# Patient Record
Sex: Male | Born: 1975 | Hispanic: No | Marital: Married | Smoking: Never smoker
Health system: Southern US, Community
[De-identification: ages and names within clinical notes are randomized; demographics above are authoritative.]

---

## 2004-11-09 ENCOUNTER — Emergency Department (HOSPITAL_COMMUNITY): Admission: EM | Admit: 2004-11-09 | Discharge: 2004-11-09 | Payer: Self-pay | Admitting: Emergency Medicine

## 2012-08-16 ENCOUNTER — Ambulatory Visit: Payer: Self-pay | Admitting: Emergency Medicine

## 2012-08-16 ENCOUNTER — Ambulatory Visit: Payer: Self-pay

## 2012-08-16 VITALS — BP 112/86 | HR 74 | Temp 98.1°F | Resp 16 | Ht 69.5 in | Wt 179.0 lb

## 2012-08-16 DIAGNOSIS — M79673 Pain in unspecified foot: Secondary | ICD-10-CM

## 2012-08-16 DIAGNOSIS — M79609 Pain in unspecified limb: Secondary | ICD-10-CM

## 2012-08-16 MED ORDER — MELOXICAM 15 MG PO TABS: 15.0000 mg | ORAL_TABLET | Freq: Every day | ORAL | Status: DC

## 2012-08-16 NOTE — Progress Notes (Signed)
  Subjective:    Patient ID: Gordon Wright, male    DOB: 1976/07/15, 36 y.o.   MRN: [id number]  HPI  36 year old male presents with right foot pain started last Friday soaked in water but woke up because of the pain, pt took ibuprofen with no relief. Today cant walk, pt walks a lot at work. denies new shoes he had to work on his feet a lot more than he does usually. He has not had any pain over the great toe has no history of gout. He has no history of any pain over his heel.    Review of Systems     Objective:   Physical Exam there is significant tenderness over the distal fourth and fifth metatarsals. Pulses are 2+ and symmetrical. There is no tenderness over the heel the plantar fascia is no tenderness over the great toe  [facility] reading (PRIMARY) by  Dr. Cleta Alberts no fracture seen        Assessment & Plan:    This sounds like a metatarsalgia or a possible stress fracture. Check a film and treat symptoms for now.

## 2012-08-16 NOTE — Patient Instructions (Addendum)
Stress Fracture When too much stress is put on the foot, as in running and jumping sports, the center shaft of the bones of the forefoot is very susceptible to stress fractures (break in bones) because of thinness of this bone. This injury is more common if osteoporosis is present or if inadequate running shoes are used. Shoes should be used which adequately cushion the foot to absorb the shocks of the activity participated in. Stress fractures are very common in competitive male runners who develop these small cracks on the surface of the bones in their legs and feet. The women most likely to suffer these injuries are those who restrict food and those who have irregular periods. Stress fractures usually start out as a minor discomfort in the foot or leg. The fracture often occurs near the end of a long run. Usually the pain goes away with rest. On the next day, the pain returns earlier in the run. If an athlete notices that it hurts to touch just one spot on a bone and then stops running for a week, they can return to running quickly. But usually the pain is ignored and a stress fracture develops. The athlete now has to avoid the hard pounding of running, but can ride a bike or swim for exercise until the fracture heals in 6 to 12 weeks. The most common sites for stress fractures are the bones in the front of the feet and the long bone of the lower leg, but running can cause stress fractures anywhere, even in the pelvic bones. DIAGNOSIS  Usually the diagnosis is made by history. The bone involved progressively becomes sorer with activities. X-rays may be negative (show no break) within the first two to three weeks of the beginning of pain. A later x-ray may show signs of healing bone (callus formation). A bone scan will usually make the diagnosis earlier. HOME CARE INSTRUCTIONS  Treatment may or may not include a cast or walking shoe. When casts are needed the use is usually for short periods of time so as  not to slow down healing with muscle wasting (atrophy).  Activities should be stopped until further advised by your caregiver.  Wear shoes with adequate shock absorbing abilities.  Alternative exercise may be undertaken while waiting for healing. These may include bicycling and swimming, or as your caregiver suggests. If you do not have a cast or splint:  You may walk on your injured foot as tolerated or advised.  Do not put any weight on your injured foot until instructed. Slowly increase the amount of time you walk on the foot as the pain allows or as advised.  Use crutches until you can bear weight without pain. A gradual increase in weight bearing may help.  Apply ice to the injury for 15 to 20 minutes each hour while awake for the first 2 days. Put the ice in a plastic bag and place a towel between the bag of ice and your skin.  Only take over-the-counter or prescription medicines for pain, discomfort, or fever as directed by your caregiver.  If your caregiver has given you a follow-up appointment, it is very important to keep that appointment. Not keeping the appointment could result in a chronic or permanent injury, pain, and disability. If there is any problem keeping the appointment, you must call back to this facility for assistance. SEEK IMMEDIATE MEDICAL CARE IF:   Pain is becoming worse rather than better, or if pain is uncontrolled with medicine.  You  have increased swelling or redness in the foot. Document Released: 10/29/2002 Document Revised: 10/31/2011 Document Reviewed: 03/24/2008 [facility] Patient Information 2013 [place], [place].

## 2015-11-19 ENCOUNTER — Ambulatory Visit: Payer: BLUE CROSS/BLUE SHIELD

## 2015-11-19 ENCOUNTER — Ambulatory Visit (INDEPENDENT_AMBULATORY_CARE_PROVIDER_SITE_OTHER): Payer: BLUE CROSS/BLUE SHIELD

## 2015-11-19 ENCOUNTER — Ambulatory Visit (INDEPENDENT_AMBULATORY_CARE_PROVIDER_SITE_OTHER): Payer: BLUE CROSS/BLUE SHIELD | Admitting: Physician Assistant

## 2015-11-19 VITALS — BP 102/70 | HR 77 | Temp 98.8°F | Resp 16 | Ht 69.0 in | Wt 208.0 lb

## 2015-11-19 DIAGNOSIS — M25572 Pain in left ankle and joints of left foot: Secondary | ICD-10-CM | POA: Diagnosis not present

## 2015-11-19 DIAGNOSIS — S93402A Sprain of unspecified ligament of left ankle, initial encounter: Secondary | ICD-10-CM | POA: Diagnosis not present

## 2015-11-19 MED ORDER — IBUPROFEN 800 MG PO TABS: 400.0000 mg | ORAL_TABLET | Freq: Three times a day (TID) | ORAL | Status: AC

## 2015-11-19 MED ORDER — KETOROLAC TROMETHAMINE 60 MG/2ML IM SOLN
60.0000 mg | Freq: Once | INTRAMUSCULAR | Status: AC
  Administered 2015-11-19: 60 mg via INTRAMUSCULAR

## 2015-11-19 NOTE — Progress Notes (Signed)
11/19/2015 5:47 PM   DOB: Dec 28, 1975 / MRN: [id number]  SUBJECTIVE:  Gordon Wright is a 40 y.o. male presenting for left ankle pain that started last night. States that he was moving into a new house yesterday and had no sudden trauma to the ankle, but last night he began to have focal tenderness about the medial ankle. Today the pain is severe and he has pain with ambulation, and pain with dorsiflexion and plantar flexion.    He has No Known Allergies.   He  has no past medical history on file.    He  reports that he has never smoked. He does not have any smokeless tobacco history on file. He  has no sexual activity history on file. The patient  has no past surgical history on file.  His family history is not on file.  Review of Systems  Constitutional: Negative for fever and chills.  Genitourinary: Negative for dysuria.  Musculoskeletal: Positive for joint pain. Negative for back pain and falls.  Skin: Negative for rash.  Neurological: Negative for dizziness and headaches.    Problem list and medications reviewed and updated by myself where necessary, and exist elsewhere in the encounter.   OBJECTIVE:  BP 102/70 mmHg  Pulse 77  Temp(Src) 98.8 F (37.1 C)  Resp 16  Ht  (1.753 m)  Wt 208 lb (94.348 kg)  BMI 30.70 kg/m2  SpO2 99%  Physical Exam  Constitutional: He is oriented to person, place, and time. He appears well-developed. He does not appear ill.  Eyes: Conjunctivae and EOM are normal. Pupils are equal, round, and reactive to light.  Cardiovascular: Normal rate and regular rhythm.   Pulmonary/Chest: Effort normal and breath sounds normal.  Abdominal: He exhibits no distension.  Musculoskeletal: Normal range of motion.       Right ankle: Normal.       Feet:  Neurological: He is alert and oriented to person, place, and time. No cranial nerve deficit. Coordination normal.  Skin: Skin is warm and dry. He is not diaphoretic.  Psychiatric: He has a normal mood  and affect.  Nursing note and vitals reviewed.   No results found for this or any previous visit (from the past 72 hour(s)).  Dg Ankle Complete Left  11/19/2015  CLINICAL DATA:  Ankle pain.  No recent injury. EXAM: LEFT ANKLE COMPLETE - 3+ VIEW COMPARISON:  08/16/2012. FINDINGS: Mild soft tissue swelling. No acute bony or joint abnormality. No evidence of fracture or dislocation. IMPRESSION: Mild soft tissue swelling.  No acute abnormality identified. Electronically Signed   By: Maisie Fus  Register   On: 11/19/2015 17:10    ASSESSMENT AND PLAN  Gordon Wright was seen today for foot injury.  Diagnoses and all orders for this visit:  Ankle sprain, left, initial encounter: Rads negative for fracture.  Mild soft tissue swelling noted.  ASO provided to him and advised he wear for two weeks at least.  800 mg Ibuprofen q8 for pain. RTC as needed.   Left ankle pain -     ketorolac (TORADOL) injection 60 mg; Inject 2 mLs (60 mg total) into the muscle once. -     DG Ankle Complete Left; Future -     ibuprofen (ADVIL,MOTRIN) 800 MG tablet; Take 0.5-1 tablets (400-800 mg total) by mouth 3 (three) times daily. Take with food.  Do not take Aleve or Goody's while taking this medication.    The patient was advised to call or return to clinic if he  does not see an improvement in symptoms or to seek the care of the closest emergency department if he worsens with the above plan.   Deliah BostonMichael Clark, MHS, PA-C [facility] 11/19/2015 5:47 PM

## 2015-11-19 NOTE — Patient Instructions (Signed)
     IF you received an x-ray today, you will receive an invoice from [facility]. Please contact [facility] at [phone] with questions or concerns regarding your invoice.   IF you received labwork today, you will receive an invoice from [facility]. Please contact [facility] at [phone] with questions or concerns regarding your invoice.   Our billing staff will not be able to assist you with questions regarding bills from these companies.  You will be contacted with the lab results as soon as they are available. The fastest way to get your results is to activate your My Chart account. Instructions are located on the last page of this paperwork. If you have not heard from us regarding the results in 2 weeks, please contact this office.      

## 2016-08-18 IMAGING — CR DG ANKLE COMPLETE 3+V*L*
4 series · 4 of 4 positions shown · non-contrast
Comparison: 08/16/2012.

CLINICAL DATA: Ankle pain.  No recent injury.

EXAM:
LEFT ANKLE COMPLETE - 3+ VIEW

[AP]
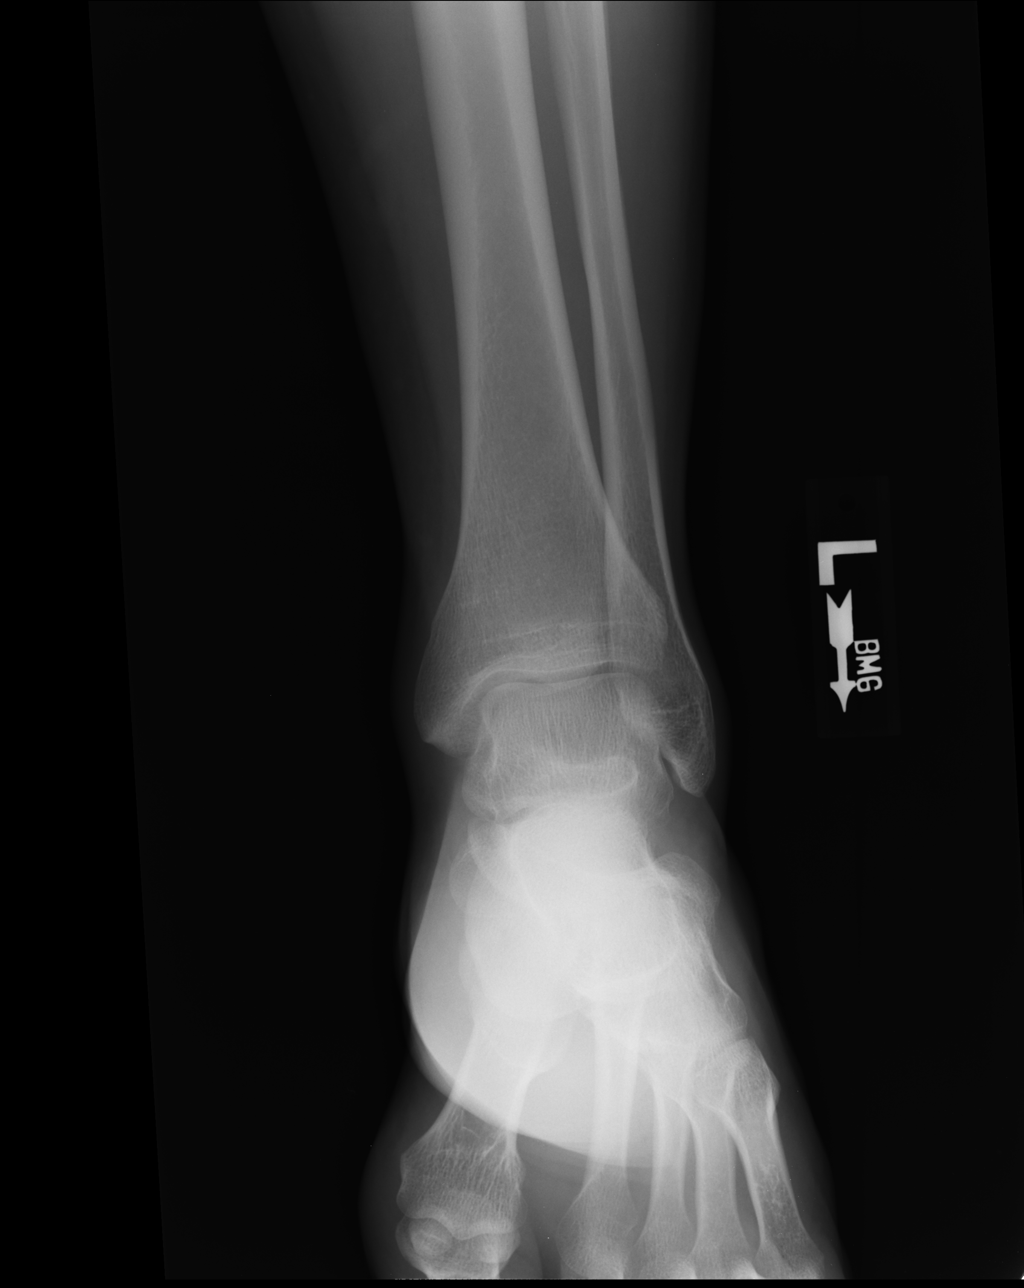

[ap obl int rot]
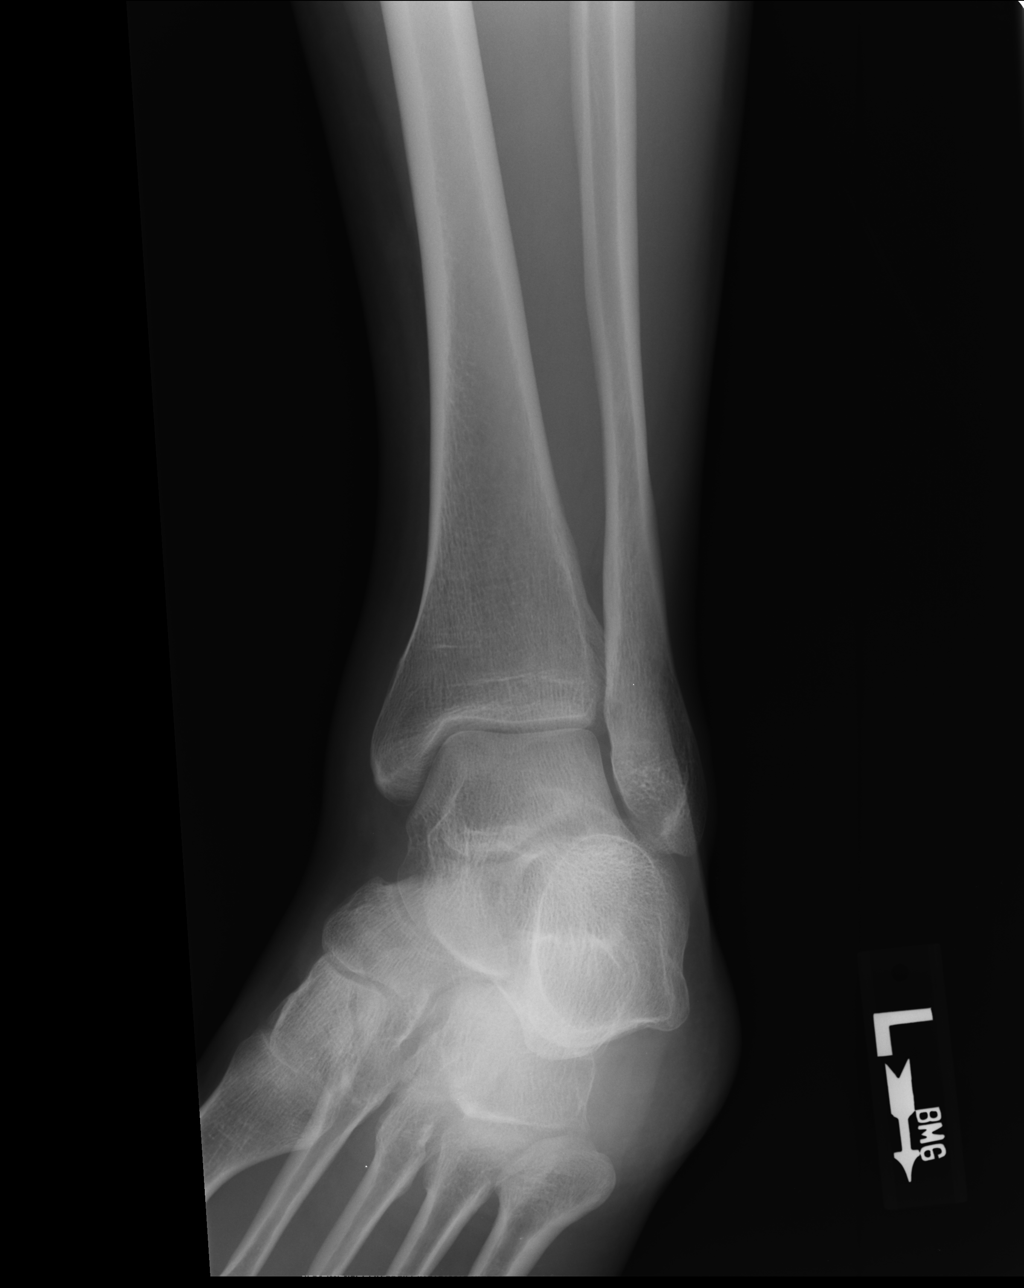

[medial obl]
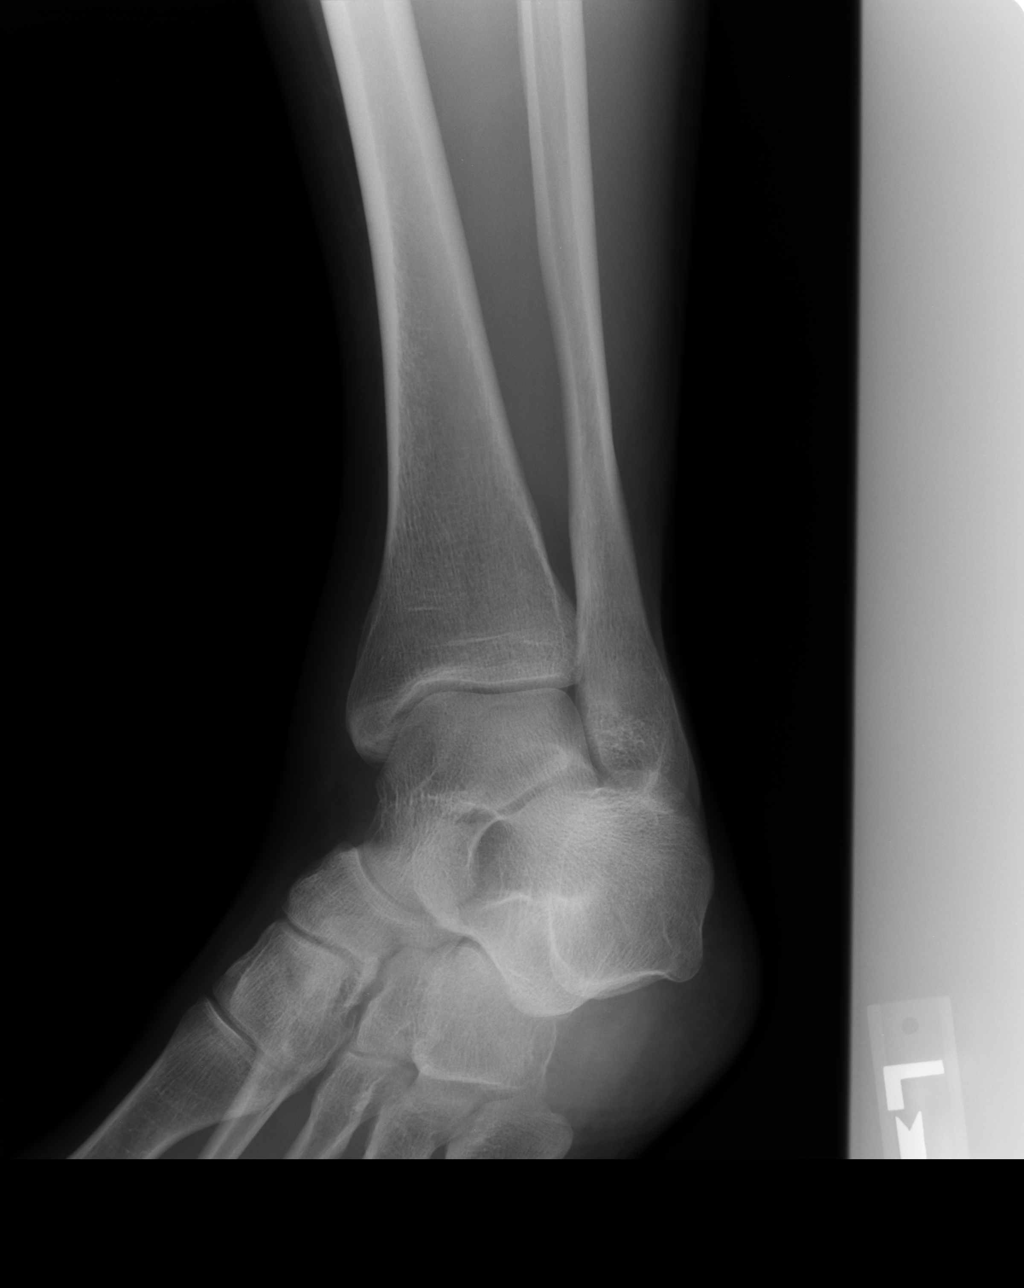

[lateral]
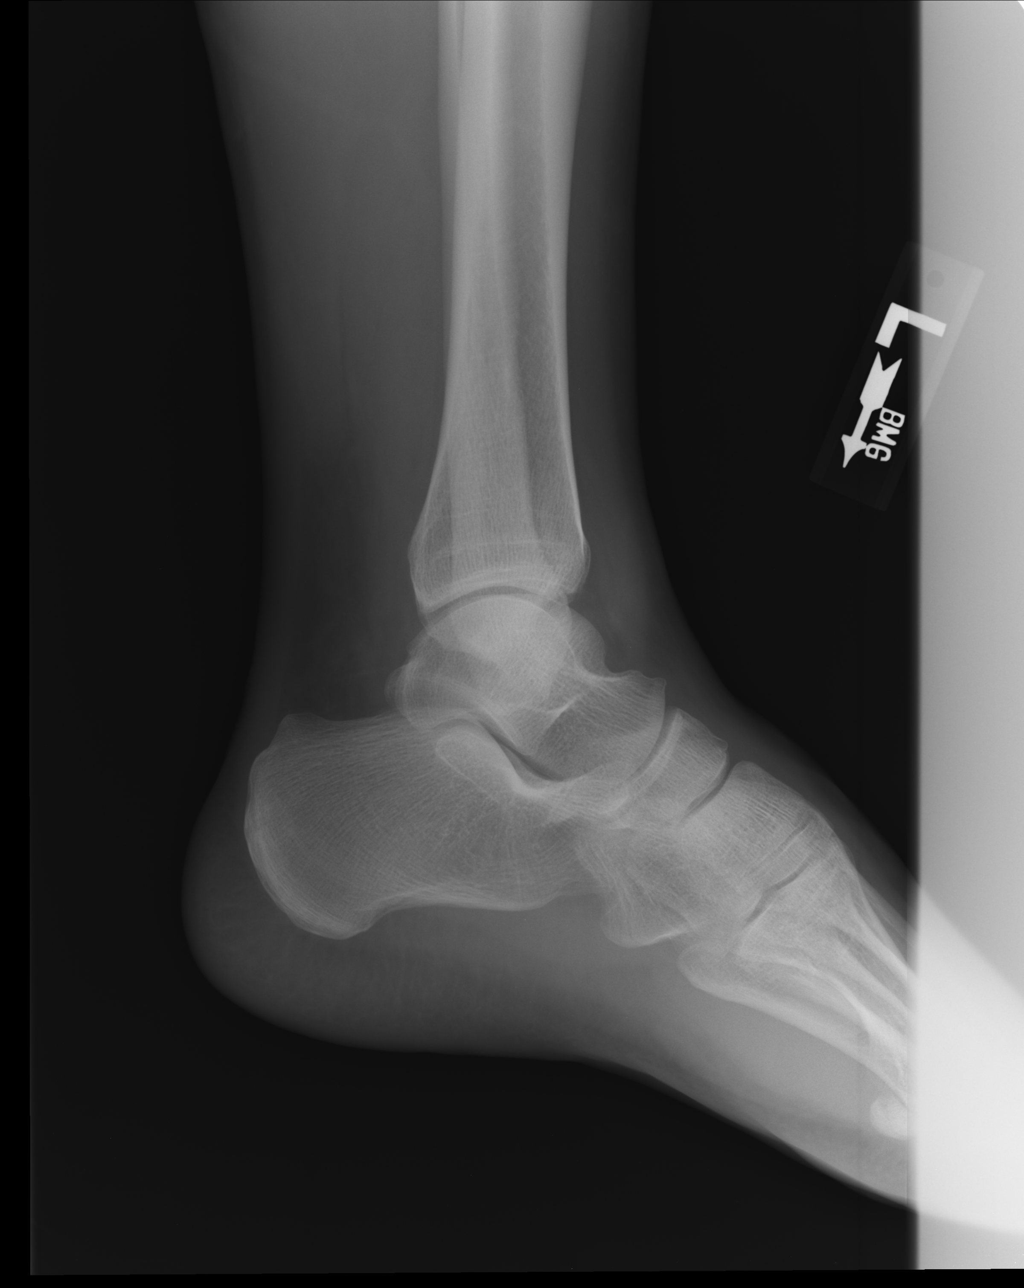

[4 of 4 positions shown; findings below may reference images not displayed]

FINDINGS: Mild soft tissue swelling. No acute bony or joint abnormality. No
evidence of fracture or dislocation.
IMPRESSION: Mild soft tissue swelling.  No acute abnormality identified.

## 2016-09-12 ENCOUNTER — Ambulatory Visit (HOSPITAL_COMMUNITY)
Admission: EM | Admit: 2016-09-12 | Discharge: 2016-09-12 | Disposition: A | Discharge status: Home / Self Care | Payer: BLUE CROSS/BLUE SHIELD | Attending: Family Medicine | Admitting: Family Medicine

## 2016-09-12 ENCOUNTER — Encounter (HOSPITAL_COMMUNITY): Payer: Self-pay | Admitting: Family Medicine

## 2016-09-12 DIAGNOSIS — R69 Illness, unspecified: Secondary | ICD-10-CM | POA: Diagnosis not present

## 2016-09-12 DIAGNOSIS — J111 Influenza due to unidentified influenza virus with other respiratory manifestations: Secondary | ICD-10-CM

## 2016-09-12 MED ORDER — ACETAMINOPHEN 325 MG PO TABS
ORAL_TABLET | ORAL | Status: AC
  Filled 2016-09-12: qty 3

## 2016-09-12 MED ORDER — GUAIFENESIN-CODEINE 100-10 MG/5ML PO SYRP
10.0000 mL | ORAL_SOLUTION | Freq: Four times a day (QID) | ORAL | 0 refills | Status: AC | PRN
Start: 2016-09-12 — End: ?

## 2016-09-12 MED ORDER — OSELTAMIVIR PHOSPHATE 75 MG PO CAPS: 75.0000 mg | ORAL_CAPSULE | Freq: Two times a day (BID) | ORAL | 0 refills | Status: AC

## 2016-09-12 MED ORDER — IPRATROPIUM BROMIDE 0.06 % NA SOLN: 2.0000 | Freq: Four times a day (QID) | NASAL | 1 refills | Status: AC

## 2016-09-12 MED ORDER — ACETAMINOPHEN 325 MG PO TABS
975.0000 mg | ORAL_TABLET | Freq: Once | ORAL | Status: AC
  Administered 2016-09-12: 975 mg via ORAL

## 2016-09-12 NOTE — Discharge Instructions (Signed)
Drink plenty of fluids as discussed, use medicine as prescribed, and mucinex or delsym for cough. Return or see your doctor if further problems °

## 2016-09-12 NOTE — ED Provider Notes (Signed)
[facility]    CSN: [id number] Arrival date & time: 09/12/16  1252     History   Chief Complaint Chief Complaint  Patient presents with  . Fever  . Generalized Body Aches  . Cough    HPI Gordon Wright is a 41 y.o. male.   The history is provided by the patient.  Influenza  Presenting symptoms: cough, fever, myalgias and rhinorrhea   Severity:  Moderate Onset quality:  Sudden Duration:  1 day Progression:  Unchanged Chronicity:  New Relieved by:  None tried Worsened by:  Nothing Ineffective treatments:  None tried Associated symptoms: nasal congestion     History reviewed. No pertinent past medical history.  There are no active problems to display for this patient.   History reviewed. No pertinent surgical history.     Home Medications    Prior to Admission medications   Not on File    Family History History reviewed. No pertinent family history.  Social History Social History  Substance Use Topics  . Smoking status: Never Smoker  . Smokeless tobacco: Never Used  . Alcohol use Not on file     Allergies   Patient has no known allergies.   Review of Systems Review of Systems  Constitutional: Positive for activity change, appetite change and fever.  HENT: Positive for congestion, postnasal drip and rhinorrhea.   Respiratory: Positive for cough.   Cardiovascular: Negative.   Gastrointestinal: Negative.   Genitourinary: Negative.   Musculoskeletal: Positive for myalgias.     Physical Exam Triage Vital Signs ED Triage Vitals [09/12/16 1337]  Enc Vitals Group     BP 113/83     Pulse Rate 106     Resp 18     Temp 103 F (39.4 C)     Temp src      SpO2 100 %     Weight      Height      Head Circumference      Peak Flow      Pain Score      Pain Loc      Pain Edu?      Excl. in GC?    No data found.   Updated Vital Signs BP 113/83   Pulse 106   Temp 103 F (39.4 C)   Resp 18   SpO2 100%   Visual  Acuity Right Eye Distance:   Left Eye Distance:   Bilateral Distance:    Right Eye Near:   Left Eye Near:    Bilateral Near:     Physical Exam  Constitutional: He appears well-developed and well-nourished. No distress.  HENT:  Right Ear: External ear normal.  Left Ear: External ear normal.  Nose: Nose normal.  Mouth/Throat: Oropharynx is clear and moist.  Eyes: Pupils are equal, round, and reactive to light.  Neck: Normal range of motion.  Cardiovascular: Normal rate and regular rhythm.   Pulmonary/Chest: Effort normal and breath sounds normal.  Abdominal: Soft. Bowel sounds are normal.  Lymphadenopathy:    He has no cervical adenopathy.  Neurological: He is alert.  Skin: Skin is warm and dry.  Nursing note and vitals reviewed.    UC Treatments / Results  Labs (all labs ordered are listed, but only abnormal results are displayed) Labs Reviewed - No data to display  EKG  EKG Interpretation None       Radiology No results found.  Procedures Procedures (including critical care time)  Medications Ordered in UC Medications  acetaminophen (TYLENOL) tablet 975 mg (975 mg Oral Given 09/12/16 1341)     Initial Impression / Assessment and Plan / UC Course  I have reviewed the triage vital signs and the nursing notes.  Pertinent labs & imaging results that were available during my care of the patient were reviewed by me and considered in my medical decision making (see chart for details).       Final Clinical Impressions(s) / UC Diagnoses   Final diagnoses:  None    New Prescriptions New Prescriptions   No medications on file     Linna HoffJames D Savreen Gebhardt, MD 09/12/16 1440

## 2016-09-12 NOTE — ED Triage Notes (Signed)
Pt here for flu like symptoms that started yesterday.
